# Patient Record
Sex: Female | Born: 1960 | Hispanic: No | Marital: Married
Health system: Southern US, Community
[De-identification: ages and names within clinical notes are randomized; demographics above are authoritative.]

---

## 2021-01-07 DIAGNOSIS — Z1211 Encounter for screening for malignant neoplasm of colon: Secondary | ICD-10-CM | POA: Insufficient documentation

## 2021-01-17 DIAGNOSIS — Z9689 Presence of other specified functional implants: Secondary | ICD-10-CM | POA: Insufficient documentation

## 2021-02-28 DIAGNOSIS — M961 Postlaminectomy syndrome, not elsewhere classified: Secondary | ICD-10-CM | POA: Insufficient documentation

## 2021-02-28 DIAGNOSIS — Z4542 Encounter for adjustment and management of neuropacemaker (brain) (peripheral nerve) (spinal cord): Secondary | ICD-10-CM | POA: Insufficient documentation

## 2021-02-28 DIAGNOSIS — M546 Pain in thoracic spine: Secondary | ICD-10-CM | POA: Insufficient documentation

## 2021-04-03 ENCOUNTER — Ambulatory Visit: Payer: No Typology Code available for payment source | Admitting: Podiatry

## 2021-04-03 ENCOUNTER — Ambulatory Visit (INDEPENDENT_AMBULATORY_CARE_PROVIDER_SITE_OTHER): Payer: No Typology Code available for payment source

## 2021-04-03 ENCOUNTER — Other Ambulatory Visit: Payer: Self-pay

## 2021-04-03 DIAGNOSIS — M722 Plantar fascial fibromatosis: Secondary | ICD-10-CM

## 2021-04-03 MED ORDER — MELOXICAM 15 MG PO TABS: 15.0000 mg | ORAL_TABLET | Freq: Every day | ORAL | 1 refills | Status: AC

## 2021-04-03 MED ORDER — METHYLPREDNISOLONE 4 MG PO TBPK: ORAL_TABLET | ORAL | 0 refills | Status: AC

## 2021-04-03 MED ORDER — BETAMETHASONE SOD PHOS & ACET 6 (3-3) MG/ML IJ SUSP
3.0000 mg | Freq: Once | INTRAMUSCULAR | Status: AC
  Administered 2021-04-03: 3 mg via INTRA_ARTICULAR

## 2021-04-03 NOTE — Progress Notes (Signed)
   Subjective: 60 y.o. female presenting as a new patient for evaluation of bilateral heel pain has been going on for approximately 2 to 3 months now.  She was referred by her PCP.  She has not done anything for treatment.  She works on her feet 10-hour shifts at [employer].  She denies a history of injury.   No past medical history on file.   Objective: Physical Exam General: The patient is alert and oriented x3 in no acute distress.  Dermatology: Skin is warm, dry and supple bilateral lower extremities. Negative for open lesions or macerations bilateral.   Vascular: Dorsalis Pedis and Posterior Tibial pulses palpable bilateral.  Capillary fill time is immediate to all digits.  Neurological: Epicritic and protective threshold intact bilateral.   Musculoskeletal: Tenderness to palpation to the plantar aspect of the bilateral heels along the plantar fascia. All other joints range of motion within normal limits bilateral. Strength 5/5 in all groups bilateral.   Radiographic exam: Normal osseous mineralization. Joint spaces preserved. No fracture/dislocation/boney destruction. No other soft tissue abnormalities or radiopaque foreign bodies.  Plantar and posterior heel spurs noted to the calcaneus bilateral  Assessment: 1. plantar fasciitis bilateral feet  Plan of Care:  1. Patient evaluated. Xrays reviewed.   2. Injection of 0.5cc Celestone soluspan injected into the bilateral heels.  3. Rx for Medrol Dose Pak placed 4. Rx for Meloxicam ordered for patient. 5. Plantar fascial band(s) dispensed for bilateral plantar fasciitis. 6. Instructed patient regarding therapies and modalities at home to alleviate symptoms.  7. Return to clinic in 4 weeks.    *Works at [employer] in [place], [place]  Erin Pruitt, DPM [facility]  Dr. Felecia Pruitt, DPM    [address] [place], [place] [address]                Office [phone]  Fax [phone]

## 2021-05-08 ENCOUNTER — Ambulatory Visit: Payer: No Typology Code available for payment source | Admitting: Podiatry

## 2021-05-08 ENCOUNTER — Other Ambulatory Visit: Payer: Self-pay

## 2021-05-08 DIAGNOSIS — M722 Plantar fascial fibromatosis: Secondary | ICD-10-CM

## 2021-05-08 MED ORDER — BETAMETHASONE SOD PHOS & ACET 6 (3-3) MG/ML IJ SUSP
3.0000 mg | Freq: Once | INTRAMUSCULAR | Status: AC
  Administered 2021-05-08: 3 mg via INTRA_ARTICULAR

## 2021-05-08 NOTE — Progress Notes (Signed)
   Subjective: 60 y.o. female presenting for follow-up evaluation of plantar fasciitis to the bilateral feet.  Patient states that she works 10-hour shifts at [employer] in [place], [place].  She says her left heel is doing very well however her right heel continues to be somewhat symptomatic although there is some overall improvement.   No past medical history on file.   Objective: Physical Exam General: The patient is alert and oriented x3 in no acute distress.  Dermatology: Skin is warm, dry and supple bilateral lower extremities. Negative for open lesions or macerations bilateral.   Vascular: Dorsalis Pedis and Posterior Tibial pulses palpable bilateral.  Capillary fill time is immediate to all digits.  Neurological: Epicritic and protective threshold intact bilateral.   Musculoskeletal: Tenderness to palpation to the plantar aspect of the right plantar heel however the left plantar heel is mostly asymptomatic and doing well. All other joints range of motion within normal limits bilateral. Strength 5/5 in all groups bilateral.   Assessment: 1. plantar fasciitis bilateral feet. RT continues to be painful. LT currently resolved  Plan of Care:  1. Patient evaluated.  2. Injection of 0.5cc Celestone soluspan injected into the left heel 3.  Patient currently takes Celebrex from her pain management physician.  Discontinue meloxicam 4.  Continue the plantar fascial brace 5.  Return to clinic as needed  *Works at [employer] in [place], [place]  Felecia Shelling, DPM [facility]  Dr. Felecia Shelling, DPM    [address] [place], [place] [address]                Office [phone]  Fax [phone]

## 2021-05-29 ENCOUNTER — Other Ambulatory Visit: Payer: Self-pay | Admitting: Internal Medicine

## 2021-05-29 DIAGNOSIS — M810 Age-related osteoporosis without current pathological fracture: Secondary | ICD-10-CM

## 2021-11-25 ENCOUNTER — Ambulatory Visit
Admission: RE | Admit: 2021-11-25 | Discharge: 2021-11-25 | Disposition: A | Discharge status: Home / Self Care | Payer: No Typology Code available for payment source | Source: Ambulatory Visit | Attending: Internal Medicine | Admitting: Internal Medicine

## 2021-11-25 DIAGNOSIS — M810 Age-related osteoporosis without current pathological fracture: Secondary | ICD-10-CM
